# Patient Record
Sex: Male | Born: 1960 | Race: White | Hispanic: No | Marital: Married | Smoking: Never smoker
Health system: Southern US, Community
[De-identification: ages and names within clinical notes are randomized; demographics above are authoritative.]

## PROBLEM LIST (undated history)

## (undated) DIAGNOSIS — R031 Nonspecific low blood-pressure reading: Secondary | ICD-10-CM

## (undated) DIAGNOSIS — K589 Irritable bowel syndrome without diarrhea: Secondary | ICD-10-CM

## (undated) HISTORY — PX: TONSILLECTOMY: SUR1361

---

## 1998-02-09 ENCOUNTER — Other Ambulatory Visit: Admission: RE | Admit: 1998-02-09 | Discharge: 1998-02-09 | Payer: Self-pay | Admitting: Gynecology

## 2016-06-06 ENCOUNTER — Emergency Department (HOSPITAL_BASED_OUTPATIENT_CLINIC_OR_DEPARTMENT_OTHER): Payer: BLUE CROSS/BLUE SHIELD

## 2016-06-06 ENCOUNTER — Emergency Department (HOSPITAL_BASED_OUTPATIENT_CLINIC_OR_DEPARTMENT_OTHER)
Admission: EM | Admit: 2016-06-06 | Discharge: 2016-06-07 | Disposition: A | Discharge status: Other Acute Inpatient Hospital | Payer: BLUE CROSS/BLUE SHIELD | Attending: Emergency Medicine | Admitting: Emergency Medicine

## 2016-06-06 ENCOUNTER — Encounter (HOSPITAL_BASED_OUTPATIENT_CLINIC_OR_DEPARTMENT_OTHER): Payer: Self-pay | Admitting: Emergency Medicine

## 2016-06-06 DIAGNOSIS — Z79899 Other long term (current) drug therapy: Secondary | ICD-10-CM | POA: Diagnosis not present

## 2016-06-06 DIAGNOSIS — S82142A Displaced bicondylar fracture of left tibia, initial encounter for closed fracture: Secondary | ICD-10-CM | POA: Insufficient documentation

## 2016-06-06 DIAGNOSIS — S82132A Displaced fracture of medial condyle of left tibia, initial encounter for closed fracture: Secondary | ICD-10-CM

## 2016-06-06 DIAGNOSIS — Y9389 Activity, other specified: Secondary | ICD-10-CM | POA: Diagnosis not present

## 2016-06-06 DIAGNOSIS — W1839XA Other fall on same level, initial encounter: Secondary | ICD-10-CM | POA: Insufficient documentation

## 2016-06-06 DIAGNOSIS — Y998 Other external cause status: Secondary | ICD-10-CM | POA: Insufficient documentation

## 2016-06-06 DIAGNOSIS — S8992XA Unspecified injury of left lower leg, initial encounter: Secondary | ICD-10-CM | POA: Diagnosis present

## 2016-06-06 DIAGNOSIS — Y929 Unspecified place or not applicable: Secondary | ICD-10-CM | POA: Diagnosis not present

## 2016-06-06 HISTORY — DX: Nonspecific low blood-pressure reading: R03.1

## 2016-06-06 HISTORY — DX: Irritable bowel syndrome, unspecified: K58.9

## 2016-06-06 MED ORDER — OXYCODONE-ACETAMINOPHEN 5-325 MG PO TABS
2.0000 | ORAL_TABLET | Freq: Once | ORAL | Status: AC
  Administered 2016-06-06: 2 via ORAL
  Filled 2016-06-06: qty 2

## 2016-06-06 MED ORDER — FENTANYL CITRATE (PF) 100 MCG/2ML IJ SOLN
100.0000 ug | Freq: Once | INTRAMUSCULAR | Status: AC
  Administered 2016-06-06: 100 ug via INTRAVENOUS
  Filled 2016-06-06: qty 2

## 2016-06-06 NOTE — ED Notes (Signed)
Patient transported to CT 

## 2016-06-06 NOTE — ED Triage Notes (Addendum)
Patient states that he was on a hover board and fell hurting his left knee. Left knee swollen  - the patient has obvious deformity to the left knee.

## 2016-06-06 NOTE — ED Notes (Signed)
Patient transported to X-ray 

## 2016-06-06 NOTE — ED Provider Notes (Signed)
[facility]   CSN: [id number] Arrival date & time: 06/06/16  1732     History   Chief Complaint Chief Complaint  Patient presents with  . Knee Pain    left    HPI Andrew Cook is a 55 y.o. male.  55 year old male who presents with left knee injury. Just prior to arrival, the patient was on a hover board and started to lose his balance. He tried to step off of it and thinks that he twisted his left knee. He denies any head injury or loss of consciousness. He had a sudden onset of severe, constant left knee pain that is worse with any flexion of the knee and he has been unable to bear weight on his leg since the injury. No previous injury to his left knee. Normal sensation foot. No other major injuries. He took 500mg  tylenol PTA.   The history is provided by the patient.    Past Medical History:  Diagnosis Date  . IBS (irritable bowel syndrome)   . Low blood pressure, not hypotension     There are no active problems to display for this patient.   Past Surgical History:  Procedure Laterality Date  . TONSILLECTOMY         Home Medications    Prior to Admission medications   Medication Sig Start Date End Date Taking? Authorizing Provider  ALPRAZolam Prudy Feeler(XANAX) 1 MG tablet Take 1 mg by mouth at bedtime as needed for anxiety.   Yes Historical Provider, MD  temazepam (RESTORIL) 15 MG capsule Take 15 mg by mouth at bedtime as needed for sleep.   Yes Historical Provider, MD    Family History History reviewed. No pertinent family history.  Social History Social History  Substance Use Topics  . Smoking status: Never Smoker  . Smokeless tobacco: Never Used  . 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   Review of Systems Review of Systems 10 Systems reviewed and are negative for acute change except as noted in the HPI.   Physical Exam Updated Vital  Signs BP (!) 100/46 (BP Location: Right Arm)   Pulse 75   Temp 98.2 F (36.8 C) (Oral)   Resp 16   Ht 5\' 11"  (1.803 m)   Wt 175 lb (79.4 kg)   SpO2 100%   BMI 24.41 kg/m   Physical Exam  Constitutional: He is oriented to person, place, and time. He appears well-developed and well-nourished. No distress.  uncomfortable  HENT:  Head: Normocephalic and atraumatic.  Eyes: Conjunctivae are normal.  Neck: Neck supple.  Cardiovascular: Intact distal pulses.   Musculoskeletal: He exhibits edema, tenderness and deformity.  L knee held in slight flexion with significant swelling and ecchymoses medial and just distal to patella   Neurological: He is alert and oriented to person, place, and time. No sensory deficit.  Skin: Skin is warm and dry.  Psychiatric: He has a normal mood and affect. Judgment normal.  Nursing note and vitals reviewed.    ED Treatments / Results  Labs (all labs ordered are listed, but only abnormal results are displayed) Labs Reviewed - No data to display  EKG  EKG Interpretation None       Radiology Dg Tibia/fibula Left  Result Date: 06/06/2016 CLINICAL DATA:  Pain after trauma EXAM: LEFT TIBIA AND FIBULA - 2 VIEW COMPARISON:  None. FINDINGS: The study images from the proximal tibial diaphysis through the distal tibia and fibula. No fibular fractures identified within visualize limits. A lucency through the proximal tibial diaphysis is consistent with a fracture which will be further evaluated on the knee films from the same day. No other abnormalities. IMPRESSION: Proximal tibial fracture, better evaluated on the knee films from same day. Electronically Signed   By: Gerome Samavid  Williams III M.D   On: 06/06/2016 18:31   Ct Knee Left Wo Contrast  Result Date: 06/06/2016 CLINICAL DATA:  Follow-up left tibial plateau fracture. Initial encounter. EXAM: CT OF THE LEFT KNEE WITHOUT CONTRAST TECHNIQUE: Multidetector CT imaging of the left knee was performed according  to the standard protocol. Multiplanar CT image reconstructions were also generated. COMPARISON:  Left knee radiographs performed earlier today at 6:18 p.m. FINDINGS: Bones/Joint/Cartilage There is a significantly comminuted fracture of the tibial plateau, both medial and lateral to the tibial spine, with extension to the metadiaphyseal region laterally. Displaced tibial spine fragments are noticed. There is focal depression of the posterior lateral tibial plateau, measuring up to 1.6 cm. There is anterior displacement of the lateral tibial plateau and tibial spine fragments, with respect to the distal femur. In addition, there is a minimally comminuted fracture involving the medial femoral condyle, without definite extension to the articular surface. The cartilage is not well assessed on CT. Ligaments Suboptimally assessed by CT. The anterior and posterior cruciate ligaments appear grossly intact. There is diffuse edema tracking about the expected location of the medial collateral ligament, and medial collateral ligament tear is a concern. The lateral collateral ligament complex is grossly unremarkable. Muscles and Tendons The quadriceps and patellar tendons are grossly unremarkable in appearance. There is suspicion of some degree of injury involving the medial soleus musculature, and anteromedial to the proximal tibia. Soft tissues A large lipohemarthrosis is noted. Soft tissue injury is noted tracking about the knee. Enlarged superficial varices are seen tracking about the left knee and proximal lower leg. IMPRESSION: 1. Significantly comminuted fracture of the tibial plateau, both medial and lateral to the tibial spine, with extension to the metadiaphyseal region laterally. Displaced tibial spine fragments noted. Focal depression of the posterior lateral tibial plateau, measuring up to 1.6 cm. Anterior displacement of the lateral tibial plateau and tibial spine fragments, with respect to the distal femur. 2.  Minimally comminuted fracture involving the medial femoral condyle, without definite extension to the femoral articular surface. 3. Diffuse edema tracking about the expected location of the medial collateral ligament. Medial collateral ligament tear is a concern. 4. Large lipohemarthrosis noted. 5. Suspect some degree of injury involving the medial soleus musculature, and anteromedial to the proximal tibia. Soft tissue injury tracking about the knee. 6. Enlarged superficial varices seen tracking about the left knee and proximal lower leg. Electronically Signed   By: Roanna RaiderJeffery  Chang M.D.   On: 06/06/2016 21:17   Dg Knee Complete 4 Views Left  Result Date: 06/06/2016 CLINICAL DATA:  Pain after trauma EXAM: LEFT KNEE - COMPLETE 4+ VIEW COMPARISON:  None. FINDINGS: There is a depressed comminuted fracture through the medial tibial plateau. The fracture extends lateral to midline based on oblique imaging. The fibula and femur are intact. Moderate joint effusion. IMPRESSION: Comminuted displaced medial tibial plateau fracture, extending just lateral to midline. Electronically Signed   By: Gerome Samavid  Williams III M.D   On: 06/06/2016 18:38    Procedures Procedures (including critical care time)  Medications Ordered in ED Medications  fentaNYL (SUBLIMAZE)  injection 100 mcg (100 mcg Intravenous Given 06/06/16 1800)  oxyCODONE-acetaminophen (PERCOCET/ROXICET) 5-325 MG per tablet 2 tablet (2 tablets Oral Given 06/06/16 1857)     Initial Impression / Assessment and Plan / ED Course  I have reviewed the triage vital signs and the nursing notes.  Pertinent imaging results that were available during my care of the patient were reviewed by me and considered in my medical decision making (see chart for details).  Clinical Course     PT w/ swelling and pain of L knee and proximal tibia after falling off hoverboard. Neurovascularly intact distally. Obtained plain films which showed tibial plateau fracture.  Discussed with orthopedics, Dr. Shon Baton, who recommended CT. CT shows a significantly comminuted medial and lateral tibial plateau fracture. Discussed further with Dr. Shon Baton, who stated that the patient would need to be managed by trauma orthopedist who is currently out of town but pt could be admitted to [facility] for monitoring and possible ex-fix. I discussed options with the patient to wanted to contact [facility] because he lives in [place]. Discussed with ER attending, Dr. Georgina Quint, who has accepted the patient in transfer. Patient will transfer by private vehicle to [facility] for orthopedic evaluation and management.  Final Clinical Impressions(s) / ED Diagnoses   Final diagnoses:  Closed fracture of medial portion of left tibial plateau, initial encounter    New Prescriptions New Prescriptions   No medications on file     Laurence Spates, MD 06/07/16 0041

## 2016-06-06 NOTE — ED Notes (Signed)
ED Provider at bedside. 

## 2016-06-07 NOTE — ED Notes (Signed)
ED Provider at bedside. 

## 2017-09-02 IMAGING — CT CT KNEE*L* W/O CM
3 series · 13 of 33 positions shown, 16 images · non-contrast
Comparison: Left knee radiographs performed earlier today at [DATE]
p.m.

CLINICAL DATA: Follow-up left tibial plateau fracture. Initial
encounter.

EXAM:
CT OF THE LEFT KNEE WITHOUT CONTRAST
TECHNIQUE: Multidetector CT imaging of the left knee was performed according to
the standard protocol. Multiplanar CT image reconstructions were
also generated.

[Series 7: axial st · axial · 0.31mm/px · z∈[-238,-110]mm · 5 of 186 slices shown, 7 images]
[im 29/186  soft-tissue]
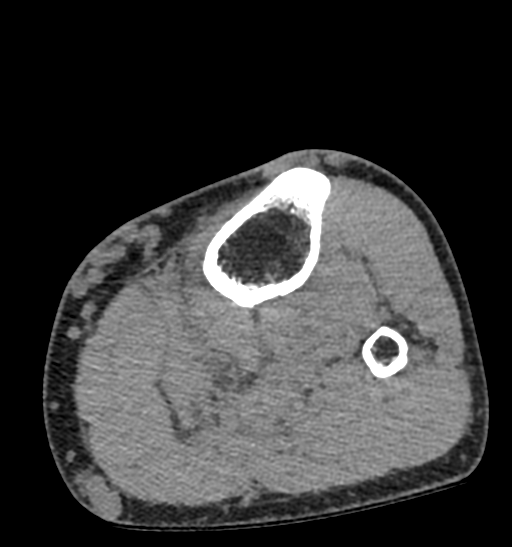
[im 29/186  bone]
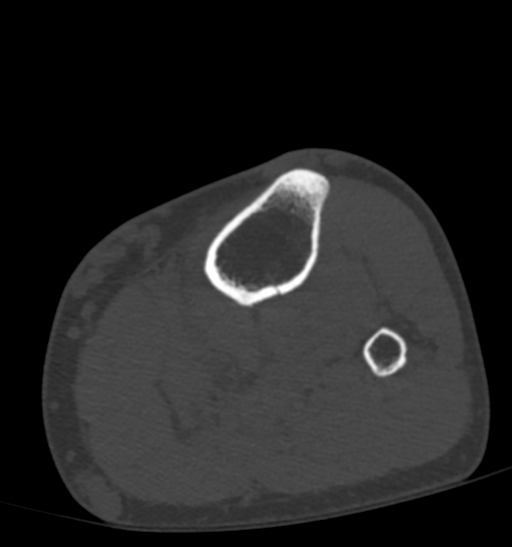
[im 57/186  bone]
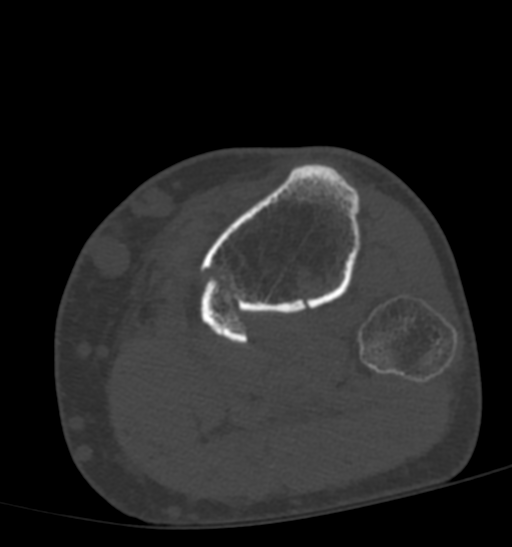
[im 100/186  bone]
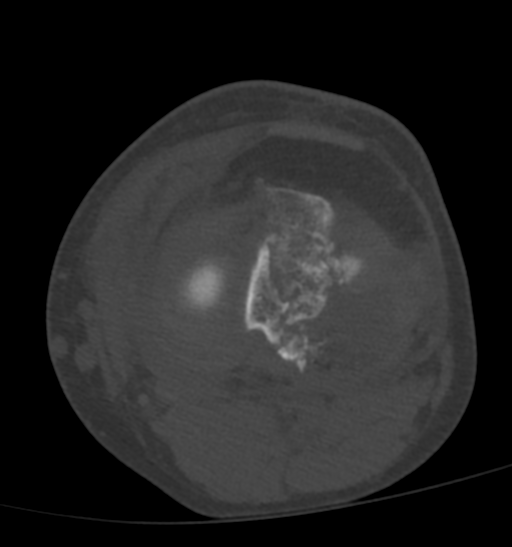
[im 129/186  bone]
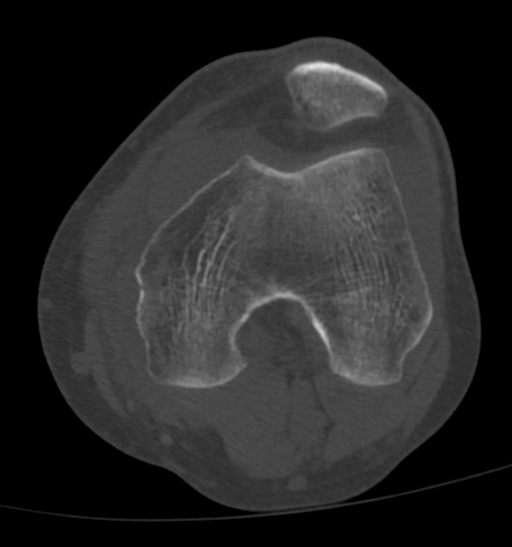
[im 157/186  soft-tissue]
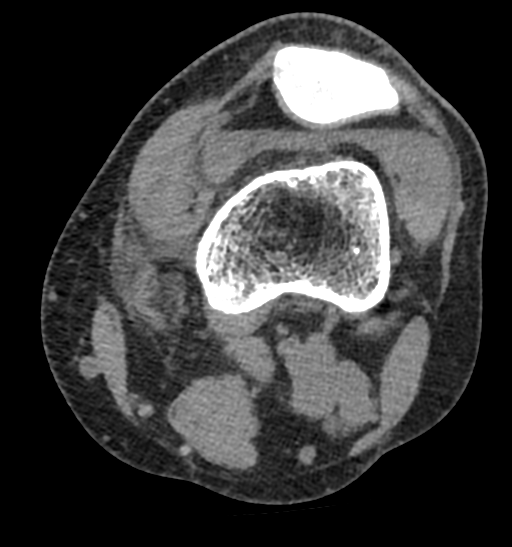
[im 157/186  bone]
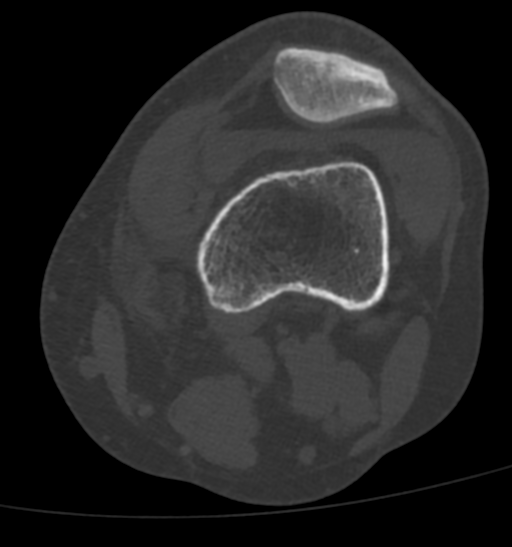

[Series 8: cor st · coronal · 0.32mm/px · 3 of 167 slices shown]
[im 34/167  bone]
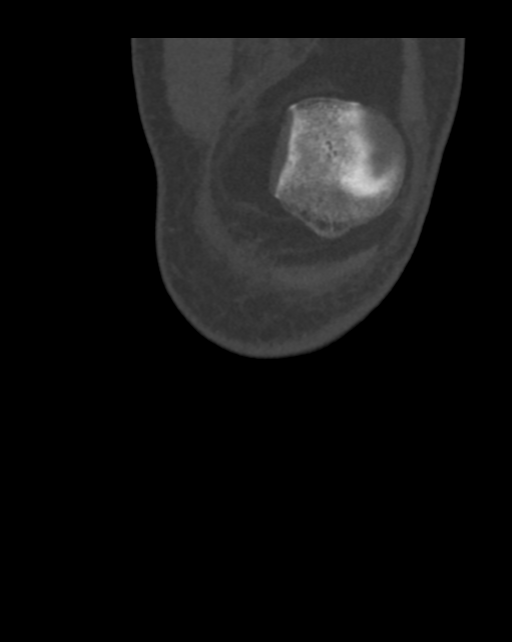
[im 67/167  bone]
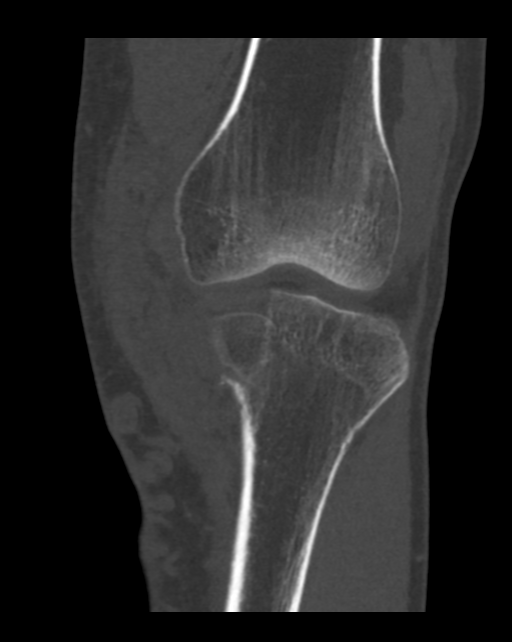
[im 100/167  bone]
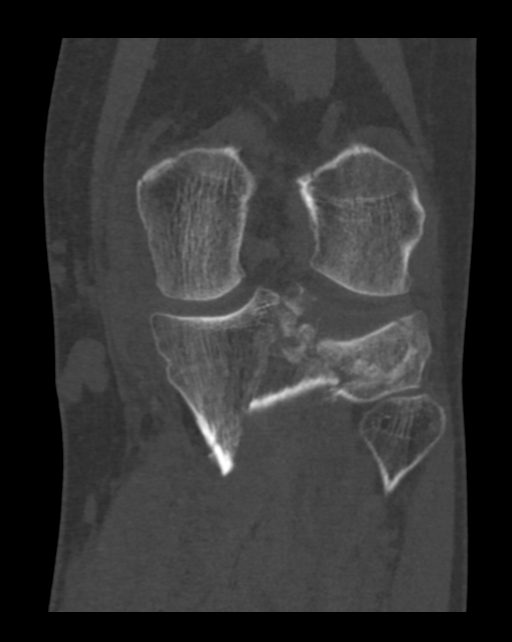

[Series 9: sag st · sagittal · 0.33mm/px · 5 of 148 slices shown, 6 images]
[im 50/148  bone]
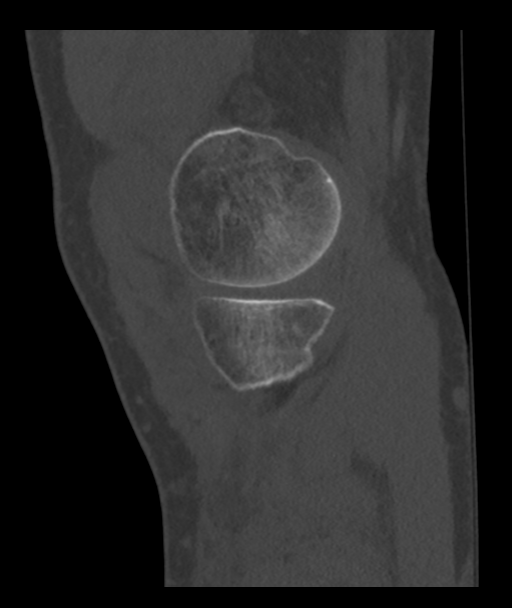
[im 62/148  bone]
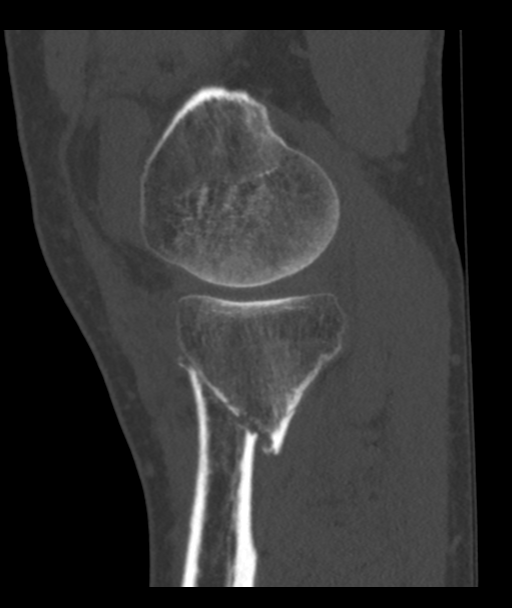
[im 74/148  soft-tissue]
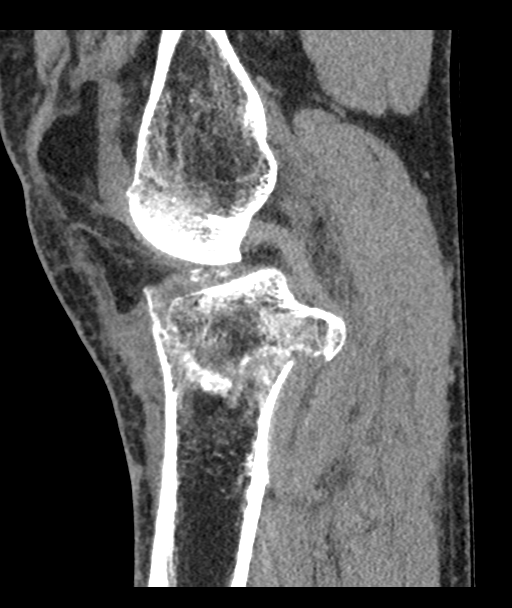
[im 74/148  bone]
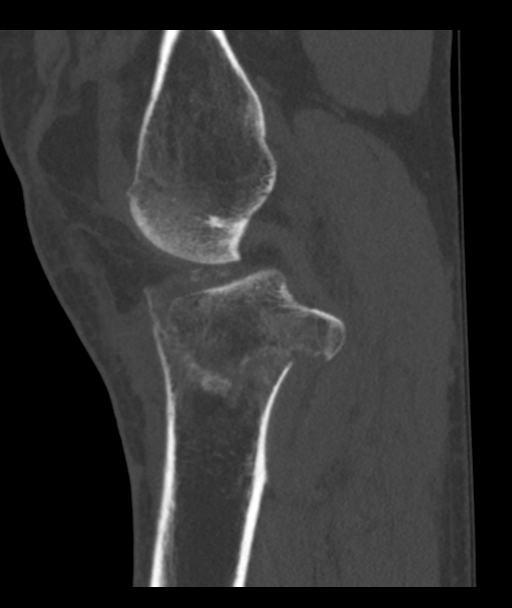
[im 86/148  bone]
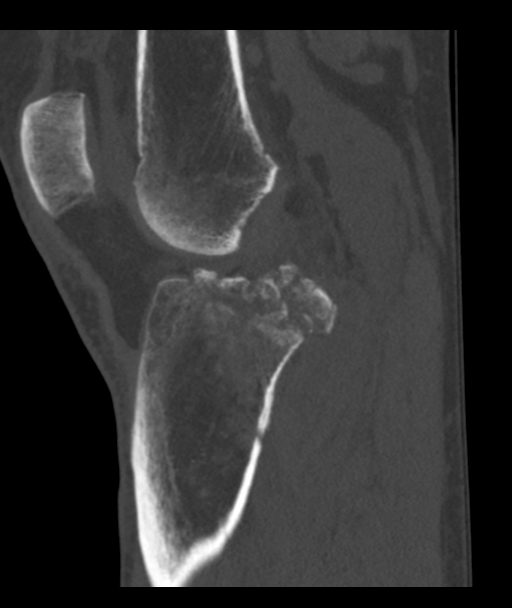
[im 99/148  bone]
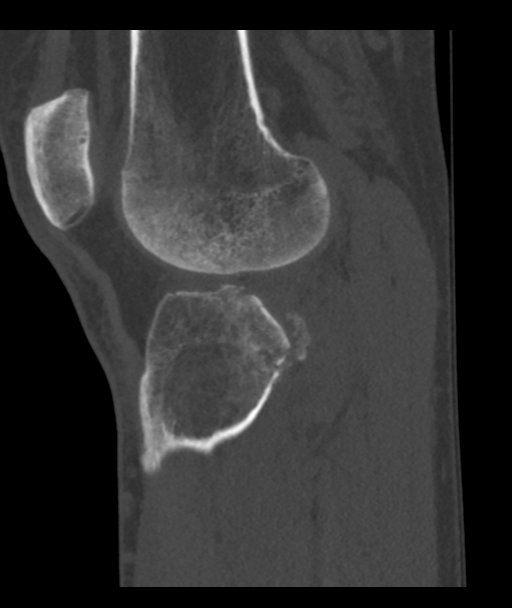

[13 of 33 positions shown; findings below may reference images not displayed]

FINDINGS: Bones/Joint/Cartilage

There is a significantly comminuted fracture of the tibial plateau,
both medial and lateral to the tibial spine, with extension to the
metadiaphyseal region laterally. Displaced tibial spine fragments
are noticed. There is focal depression of the posterior lateral
tibial plateau, measuring up to 1.6 cm.

There is anterior displacement of the lateral tibial plateau and
tibial spine fragments, with respect to the distal femur.

In addition, there is a minimally comminuted fracture involving the
medial femoral condyle, without definite extension to the articular
surface.

The cartilage is not well assessed on CT.

Ligaments

Suboptimally assessed by CT. The anterior and posterior cruciate
ligaments appear grossly intact. There is diffuse edema tracking
about the expected location of the medial collateral ligament, and
medial collateral ligament tear is a concern. The lateral collateral
ligament complex is grossly unremarkable.

Muscles and Tendons

The quadriceps and patellar tendons are grossly unremarkable in
appearance. There is suspicion of some degree of injury involving
the medial soleus musculature, and anteromedial to the proximal
tibia.

Soft tissues

A large lipohemarthrosis is noted. Soft tissue injury is noted
tracking about the knee. Enlarged superficial varices are seen
tracking about the left knee and proximal lower leg.
IMPRESSION: 1. Significantly comminuted fracture of the tibial plateau, both
medial and lateral to the tibial spine, with extension to the
metadiaphyseal region laterally. Displaced tibial spine fragments
noted. Focal depression of the posterior lateral tibial plateau,
measuring up to 1.6 cm. Anterior displacement of the lateral tibial
plateau and tibial spine fragments, with respect to the distal
femur.
2. Minimally comminuted fracture involving the medial femoral
condyle, without definite extension to the femoral articular
surface.
3. Diffuse edema tracking about the expected location of the medial
collateral ligament. Medial collateral ligament tear is a concern.
4. Large lipohemarthrosis noted.
5. Suspect some degree of injury involving the medial soleus
musculature, and anteromedial to the proximal tibia. Soft tissue
injury tracking about the knee.
6. Enlarged superficial varices seen tracking about the left knee
and proximal lower leg.
